# Patient Record
Sex: Male | Born: 1960 | Race: White | Hispanic: No | Marital: Married | Smoking: Never smoker
Health system: Southern US, Community
[De-identification: ages and names within clinical notes are randomized; demographics above are authoritative.]

## PROBLEM LIST (undated history)

## (undated) DIAGNOSIS — E785 Hyperlipidemia, unspecified: Secondary | ICD-10-CM

## (undated) DIAGNOSIS — K5190 Ulcerative colitis, unspecified, without complications: Secondary | ICD-10-CM

## (undated) HISTORY — DX: Hyperlipidemia, unspecified: E78.5

## (undated) HISTORY — PX: KNEE SURGERY: SHX244

## (undated) HISTORY — DX: Ulcerative colitis, unspecified, without complications: K51.90

## (undated) HISTORY — PX: WRIST FRACTURE SURGERY: SHX121

---

## 2005-06-14 ENCOUNTER — Emergency Department (HOSPITAL_COMMUNITY): Admission: EM | Admit: 2005-06-14 | Discharge: 2005-06-14 | Payer: Self-pay | Admitting: Emergency Medicine

## 2012-12-08 ENCOUNTER — Encounter: Payer: Federal, State, Local not specified - PPO | Attending: Family Medicine | Admitting: Dietician

## 2012-12-08 ENCOUNTER — Encounter: Payer: Self-pay | Admitting: Dietician

## 2012-12-08 VITALS — Ht 69.0 in | Wt 212.0 lb

## 2012-12-08 DIAGNOSIS — E119 Type 2 diabetes mellitus without complications: Secondary | ICD-10-CM

## 2012-12-08 DIAGNOSIS — Z713 Dietary counseling and surveillance: Secondary | ICD-10-CM | POA: Insufficient documentation

## 2012-12-08 NOTE — Progress Notes (Signed)
Patient was seen on 12/08/12 for the first of a series of three diabetes self-management courses at the [facility].  Current HbA1c: 15.2 in early November  The following learning objectives were met by the patient during this class:  Describe diabetes  State some common risk factors for diabetes  Defines the role of glucose and insulin  Identifies type of diabetes and pathophysiology  Describe the relationship between diabetes and cardiovascular risk  State the members of the Healthcare Team  States the rationale for glucose monitoring  State when to test glucose  State their individual Target Range  State the importance of logging glucose readings  Describe how to interpret glucose readings  Identifies A1C target  Explain the correlation between A1c and eAG values  State symptoms and treatment of high blood glucose  State symptoms and treatment of low blood glucose  Explain proper technique for glucose testing  Identifies proper sharps disposal  Handouts given during class include:  Living Well with Diabetes book  Carb Counting and Meal Planning book  Meal Plan Card  Carbohydrate guide  Meal planning worksheet  Low Sodium Flavoring Tips  The diabetes portion plate  Low Carbohydrate Snack Suggestions  A1c to eAG Conversion Chart  Diabetes Medications  Stress Management  Diabetes Recommended Care Schedule  Diabetes Success Plan  Core Class Satisfaction Survey  Your patient has identified their diabetes care support plan as:  [facility]  Staff  Follow-Up Plan:  Attend core 2

## 2012-12-08 NOTE — Patient Instructions (Signed)
Goals:  Monitor glucose levels as instructed by your doctor  Bring food record and glucose log to your next nutrition visit 

## 2012-12-28 ENCOUNTER — Encounter: Payer: Federal, State, Local not specified - PPO | Attending: Family Medicine

## 2012-12-28 DIAGNOSIS — E119 Type 2 diabetes mellitus without complications: Secondary | ICD-10-CM

## 2012-12-28 DIAGNOSIS — Z713 Dietary counseling and surveillance: Secondary | ICD-10-CM | POA: Insufficient documentation

## 2012-12-29 NOTE — Progress Notes (Signed)

## 2013-01-04 DIAGNOSIS — E119 Type 2 diabetes mellitus without complications: Secondary | ICD-10-CM

## 2013-01-04 NOTE — Progress Notes (Signed)
Patient was seen on 01/04/13 for the third of a series of three diabetes self-management courses at the [facility]. The following learning objectives were met by the patient during this class:    State the amount of activity recommended for healthy living   Describe activities suitable for individual needs   Identify ways to regularly incorporate activity into daily life   Identify barriers to activity and ways to over come these barriers  Identify diabetes medications being personally used and their primary action for lowering glucose and possible side effects   Describe role of stress on blood glucose and develop strategies to address psychosocial issues   Identify diabetes complications and ways to prevent them  Explain how to manage diabetes during illness   Evaluate success in meeting personal goal   Establish 2-3 goals that they will plan to diligently work on until they return for the  6-month follow-up visit  Goals:  Follow Diabetes Meal Plan as instructed  Aim for 15-30 mins of physical activity daily as tolerated  Bring food record and glucose log to your follow up visit  Your patient has established the following 4 month goals in their individualized success plan: I will count my carb choices at most meals and snacks I will increase my activity level at least 5 days a week  Your patient has identified these potential barriers to change:  Time  Your patient has identified their diabetes self-care support plan as  Family, specifically my wife and my sister - in - law who is a diabetes educator

## 2015-02-22 ENCOUNTER — Ambulatory Visit (HOSPITAL_BASED_OUTPATIENT_CLINIC_OR_DEPARTMENT_OTHER): Payer: Federal, State, Local not specified - PPO

## 2015-05-04 DIAGNOSIS — R3915 Urgency of urination: Secondary | ICD-10-CM | POA: Diagnosis not present

## 2015-05-08 DIAGNOSIS — N5202 Corporo-venous occlusive erectile dysfunction: Secondary | ICD-10-CM | POA: Diagnosis not present

## 2015-05-08 DIAGNOSIS — R3915 Urgency of urination: Secondary | ICD-10-CM | POA: Diagnosis not present

## 2015-05-30 DIAGNOSIS — E119 Type 2 diabetes mellitus without complications: Secondary | ICD-10-CM | POA: Diagnosis not present

## 2015-05-30 DIAGNOSIS — Z79899 Other long term (current) drug therapy: Secondary | ICD-10-CM | POA: Diagnosis not present

## 2015-05-30 DIAGNOSIS — E785 Hyperlipidemia, unspecified: Secondary | ICD-10-CM | POA: Diagnosis not present

## 2015-09-18 DIAGNOSIS — M11261 Other chondrocalcinosis, right knee: Secondary | ICD-10-CM | POA: Diagnosis not present

## 2015-09-18 DIAGNOSIS — Z1810 Retained metal fragments, unspecified: Secondary | ICD-10-CM | POA: Diagnosis not present

## 2015-09-18 DIAGNOSIS — M25561 Pain in right knee: Secondary | ICD-10-CM | POA: Diagnosis not present

## 2015-09-18 DIAGNOSIS — M1711 Unilateral primary osteoarthritis, right knee: Secondary | ICD-10-CM | POA: Diagnosis not present

## 2015-09-26 DIAGNOSIS — S8981XA Other specified injuries of right lower leg, initial encounter: Secondary | ICD-10-CM | POA: Diagnosis not present

## 2015-10-09 DIAGNOSIS — M25561 Pain in right knee: Secondary | ICD-10-CM | POA: Diagnosis not present

## 2015-10-09 DIAGNOSIS — Z23 Encounter for immunization: Secondary | ICD-10-CM | POA: Diagnosis not present

## 2015-10-09 DIAGNOSIS — Z0000 Encounter for general adult medical examination without abnormal findings: Secondary | ICD-10-CM | POA: Diagnosis not present

## 2015-10-09 DIAGNOSIS — K219 Gastro-esophageal reflux disease without esophagitis: Secondary | ICD-10-CM | POA: Diagnosis not present

## 2015-10-11 DIAGNOSIS — S8981XD Other specified injuries of right lower leg, subsequent encounter: Secondary | ICD-10-CM | POA: Diagnosis not present

## 2015-12-03 DIAGNOSIS — N529 Male erectile dysfunction, unspecified: Secondary | ICD-10-CM | POA: Diagnosis not present

## 2015-12-03 DIAGNOSIS — E785 Hyperlipidemia, unspecified: Secondary | ICD-10-CM | POA: Diagnosis not present

## 2015-12-03 DIAGNOSIS — J309 Allergic rhinitis, unspecified: Secondary | ICD-10-CM | POA: Diagnosis not present

## 2015-12-03 DIAGNOSIS — E119 Type 2 diabetes mellitus without complications: Secondary | ICD-10-CM | POA: Diagnosis not present

## 2016-01-19 DIAGNOSIS — J018 Other acute sinusitis: Secondary | ICD-10-CM | POA: Diagnosis not present

## 2016-02-26 DIAGNOSIS — J3081 Allergic rhinitis due to animal (cat) (dog) hair and dander: Secondary | ICD-10-CM | POA: Diagnosis not present

## 2016-02-26 DIAGNOSIS — H1045 Other chronic allergic conjunctivitis: Secondary | ICD-10-CM | POA: Diagnosis not present

## 2016-02-26 DIAGNOSIS — J3089 Other allergic rhinitis: Secondary | ICD-10-CM | POA: Diagnosis not present

## 2016-02-26 DIAGNOSIS — J4520 Mild intermittent asthma, uncomplicated: Secondary | ICD-10-CM | POA: Diagnosis not present

## 2016-06-02 DIAGNOSIS — E119 Type 2 diabetes mellitus without complications: Secondary | ICD-10-CM | POA: Diagnosis not present

## 2016-06-02 DIAGNOSIS — N529 Male erectile dysfunction, unspecified: Secondary | ICD-10-CM | POA: Diagnosis not present

## 2016-06-02 DIAGNOSIS — E785 Hyperlipidemia, unspecified: Secondary | ICD-10-CM | POA: Diagnosis not present

## 2016-06-02 DIAGNOSIS — J309 Allergic rhinitis, unspecified: Secondary | ICD-10-CM | POA: Diagnosis not present

## 2016-10-09 DIAGNOSIS — E785 Hyperlipidemia, unspecified: Secondary | ICD-10-CM | POA: Diagnosis not present

## 2016-10-09 DIAGNOSIS — Z23 Encounter for immunization: Secondary | ICD-10-CM | POA: Diagnosis not present

## 2016-10-09 DIAGNOSIS — Z1159 Encounter for screening for other viral diseases: Secondary | ICD-10-CM | POA: Diagnosis not present

## 2016-10-09 DIAGNOSIS — I10 Essential (primary) hypertension: Secondary | ICD-10-CM | POA: Diagnosis not present

## 2016-10-09 DIAGNOSIS — E119 Type 2 diabetes mellitus without complications: Secondary | ICD-10-CM | POA: Diagnosis not present

## 2016-10-09 DIAGNOSIS — K219 Gastro-esophageal reflux disease without esophagitis: Secondary | ICD-10-CM | POA: Diagnosis not present

## 2016-12-08 DIAGNOSIS — J309 Allergic rhinitis, unspecified: Secondary | ICD-10-CM | POA: Diagnosis not present

## 2016-12-08 DIAGNOSIS — E785 Hyperlipidemia, unspecified: Secondary | ICD-10-CM | POA: Diagnosis not present

## 2016-12-08 DIAGNOSIS — E119 Type 2 diabetes mellitus without complications: Secondary | ICD-10-CM | POA: Diagnosis not present

## 2016-12-08 DIAGNOSIS — N529 Male erectile dysfunction, unspecified: Secondary | ICD-10-CM | POA: Diagnosis not present

## 2017-01-03 DIAGNOSIS — B9689 Other specified bacterial agents as the cause of diseases classified elsewhere: Secondary | ICD-10-CM | POA: Diagnosis not present

## 2017-01-03 DIAGNOSIS — J019 Acute sinusitis, unspecified: Secondary | ICD-10-CM | POA: Diagnosis not present

## 2017-04-11 ENCOUNTER — Other Ambulatory Visit: Payer: Self-pay

## 2017-04-11 ENCOUNTER — Emergency Department (HOSPITAL_COMMUNITY)
Admission: EM | Admit: 2017-04-11 | Discharge: 2017-04-11 | Disposition: A | Discharge status: Home / Self Care | Payer: Federal, State, Local not specified - PPO | Attending: Emergency Medicine | Admitting: Emergency Medicine

## 2017-04-11 ENCOUNTER — Emergency Department (HOSPITAL_COMMUNITY): Payer: Federal, State, Local not specified - PPO

## 2017-04-11 ENCOUNTER — Encounter (HOSPITAL_COMMUNITY): Payer: Self-pay | Admitting: Emergency Medicine

## 2017-04-11 DIAGNOSIS — Y999 Unspecified external cause status: Secondary | ICD-10-CM | POA: Diagnosis not present

## 2017-04-11 DIAGNOSIS — Z23 Encounter for immunization: Secondary | ICD-10-CM | POA: Insufficient documentation

## 2017-04-11 DIAGNOSIS — Z79899 Other long term (current) drug therapy: Secondary | ICD-10-CM | POA: Insufficient documentation

## 2017-04-11 DIAGNOSIS — Y929 Unspecified place or not applicable: Secondary | ICD-10-CM | POA: Diagnosis not present

## 2017-04-11 DIAGNOSIS — Y9389 Activity, other specified: Secondary | ICD-10-CM | POA: Insufficient documentation

## 2017-04-11 DIAGNOSIS — W231XXA Caught, crushed, jammed, or pinched between stationary objects, initial encounter: Secondary | ICD-10-CM | POA: Insufficient documentation

## 2017-04-11 DIAGNOSIS — S61215A Laceration without foreign body of left ring finger without damage to nail, initial encounter: Secondary | ICD-10-CM | POA: Insufficient documentation

## 2017-04-11 MED ORDER — TETANUS-DIPHTH-ACELL PERTUSSIS 5-2.5-18.5 LF-MCG/0.5 IM SUSP: 0.5000 mL | Freq: Once | INTRAMUSCULAR | Status: DC

## 2017-04-11 MED ORDER — LIDOCAINE HCL (PF) 2 % IJ SOLN
INTRAMUSCULAR | Status: AC
  Filled 2017-04-11: qty 20

## 2017-04-11 MED ORDER — HYDROCODONE-ACETAMINOPHEN 5-325 MG PO TABS: 1.0000 | ORAL_TABLET | ORAL | 0 refills | Status: AC | PRN

## 2017-04-11 MED ORDER — LIDOCAINE HCL (PF) 2 % IJ SOLN: 10.0000 mL | Freq: Once | INTRAMUSCULAR | Status: DC

## 2017-04-11 NOTE — ED Notes (Signed)
Patient transported to X-ray 

## 2017-04-11 NOTE — ED Notes (Signed)
Pt ambulatory to waiting room. Pt verbalized understanding of discharge instructions.   

## 2017-04-11 NOTE — Discharge Instructions (Signed)
Have your sutures removed in 10 days. Change your dressing daily.  Keep your wound clean and dry,  Until a good scab forms - you may then wash gently twice daily with mild soap and water, but dry completely after.  Get rechecked for any sign of infection (redness,  Swelling,  Increased pain or drainage of purulent fluid). You may take the pain medicine prescribed if needed for pain relief.    This will make you drowsy - do not drive within 4 hours of taking this medication.

## 2017-04-11 NOTE — ED Triage Notes (Signed)
Pt c/o left ring laceration from crush injury.

## 2017-04-12 NOTE — ED Provider Notes (Signed)
[facility] Provider Note   CSN: [id number] Arrival date & time: 04/11/17  1849     History   Chief Complaint Chief Complaint  Patient presents with  . Laceration    HPI Michael Miles is a 57 y.o. male presenting with laceration to his left ring finger prior to arrival when the finger was crushed between 2 pieces of metal while trying to install an implement on his tractor.  He denies numbness distal to the injury site.  He has applied pressure to help with bleeding.  He denies any other injury.  He is left handed and is utd with his tetanus.  The history is provided by the patient.    Past Medical History:  Diagnosis Date  . Hyperlipidemia   . Ulcerative colitis (HCC)     There are no active problems to display for this patient.   Past Surgical History:  Procedure Laterality Date  . KNEE SURGERY    . WRIST FRACTURE SURGERY          Home Medications    Prior to Admission medications   Medication Sig Start Date End Date Taking? Authorizing Provider  atorvastatin (LIPITOR) 20 MG tablet Take 20 mg by mouth daily.    [provider]  azelastine (ASTELIN) 137 MCG/SPRAY nasal spray Place into both nostrils 2 (two) times daily. Use in each nostril as directed    [provider]  fexofenadine (ALLEGRA) 180 MG tablet Take 180 mg by mouth daily.    [provider]  FLUTICASONE PROPIONATE, NASAL, NA Place into the nose.    [provider]  HYDROcodone-acetaminophen (NORCO/VICODIN) 5-325 MG tablet Take 1 tablet by mouth every 4 (four) hours as needed. 04/11/17   Burgess AmorIdol, Stokely Jeancharles, PA-C  lisinopril (PRINIVIL,ZESTRIL) 5 MG tablet Take 5 mg by mouth daily.    [provider]  metFORMIN (GLUCOPHAGE) 500 MG tablet Take by mouth 2 (two) times daily with a meal.    [provider]  Omega-3 Krill Oil 300 MG CAPS Take by mouth.    [provider]  omeprazole (PRILOSEC) 10 MG capsule Take 20 mg by mouth  daily.    [provider]  sitaGLIPtin (JANUVIA) 100 MG tablet Take 100 mg by mouth daily.    [provider]  tadalafil (CIALIS) 5 MG tablet Take 5 mg by mouth daily as needed for erectile dysfunction.    [provider]    Family History No family history on file.  Social History Social History   Tobacco Use  . Smoking status: Never Smoker  . Smokeless tobacco: Never Used  Substance Use Topics  . Alcohol use: Yes  . Drug use: Never     Allergies   Benadryl [diphenhydramine]   Review of Systems Review of Systems  Constitutional: Negative.   Respiratory: Negative.   Cardiovascular: Negative.   Gastrointestinal: Negative.   Musculoskeletal: Positive for arthralgias. Negative for joint swelling and myalgias.  Skin: Positive for wound.  Neurological: Negative for weakness and numbness.     Physical Exam Updated Vital Signs BP (!) 136/99   Pulse 78   Temp 98.4 F (36.9 C)   Resp 17   Ht 5\' 11"  (1.803 m)   Wt 102.1 kg (225 lb)   SpO2 96%   BMI 31.38 kg/m   Physical Exam  Constitutional: He is oriented to person, place, and time. He appears well-developed and well-nourished.  HENT:  Head: Normocephalic.  Cardiovascular: Normal rate.  Pulmonary/Chest: Effort normal.  Musculoskeletal: He exhibits tenderness. He exhibits no deformity.       Left hand: He exhibits swelling. Normal sensation noted. Normal strength noted.       Hands: Neurological: He is alert and oriented to person, place, and time. No sensory deficit.  Skin: Laceration noted.     ED Treatments / Results  Labs (all labs ordered are listed, but only abnormal results are displayed) Labs Reviewed - No data to display  EKG None  Radiology Dg Finger Ring Left  Result Date: 04/11/2017 CLINICAL DATA:  Laceration from crush injury. EXAM: LEFT RING FINGER 2+V COMPARISON:  None FINDINGS: Three-view centered about the ring finger of the left hand demonstrate no fracture  or dislocation. No radiopaque foreign object. Mild soft tissue swelling distally. IMPRESSION: No acute osseous abnormality. Electronically Signed   By: Jeronimo Greaves M.D.   On: 04/11/2017 20:00    Procedures Procedures (including critical care time)  LACERATION REPAIR Performed by: Burgess Amor Authorized by: Burgess Amor Consent: Verbal consent obtained. Risks and benefits: risks, benefits and alternatives were discussed Consent given by: patient Patient identity confirmed: provided demographic data Prepped and Draped in normal sterile fashion Wound explored  Laceration Location: left ring finger  Laceration Length: 2 cm  No Foreign Bodies seen or palpated  Anesthesia: digital block  Local anesthetic: lidocaine 2% without epinephrine  Anesthetic total: 2 ml  Irrigation method: syringe Amount of cleaning: standard  Skin closure: ethilon 4-0   Number of sutures: 5  Technique: simple interupted  Patient tolerance: Patient tolerated the procedure well with no immediate complications.   Medications Ordered in ED Medications - No data to display   Initial Impression / Assessment and Plan / ED Course  I have reviewed the triage vital signs and the nursing notes.  Pertinent labs & imaging results that were available during my care of the patient were reviewed by me and considered in my medical decision making (see chart for details).     Wound care instructions given.  Pt advised to have sutures removed in 10 days,  Return here sooner for any signs of infection including redness, swelling, worse pain or drainage of pus.     Final Clinical Impressions(s) / ED Diagnoses   Final diagnoses:  Laceration of left ring finger without foreign body without damage to nail, initial encounter    ED Discharge Orders        Ordered    HYDROcodone-acetaminophen (NORCO/VICODIN) 5-325 MG tablet  Every 4 hours PRN     04/11/17 2047       Burgess Amor, PA-C 04/12/17 1645      Linwood Dibbles, MD 04/20/17 [phone]

## 2017-06-04 DIAGNOSIS — M62830 Muscle spasm of back: Secondary | ICD-10-CM | POA: Diagnosis not present

## 2017-06-04 DIAGNOSIS — M9903 Segmental and somatic dysfunction of lumbar region: Secondary | ICD-10-CM | POA: Diagnosis not present

## 2017-06-09 DIAGNOSIS — M9901 Segmental and somatic dysfunction of cervical region: Secondary | ICD-10-CM | POA: Diagnosis not present

## 2017-06-09 DIAGNOSIS — M5412 Radiculopathy, cervical region: Secondary | ICD-10-CM | POA: Diagnosis not present

## 2017-06-10 DIAGNOSIS — N529 Male erectile dysfunction, unspecified: Secondary | ICD-10-CM | POA: Diagnosis not present

## 2017-06-10 DIAGNOSIS — J309 Allergic rhinitis, unspecified: Secondary | ICD-10-CM | POA: Diagnosis not present

## 2017-06-10 DIAGNOSIS — E785 Hyperlipidemia, unspecified: Secondary | ICD-10-CM | POA: Diagnosis not present

## 2017-06-10 DIAGNOSIS — E119 Type 2 diabetes mellitus without complications: Secondary | ICD-10-CM | POA: Diagnosis not present

## 2017-06-10 DIAGNOSIS — M5412 Radiculopathy, cervical region: Secondary | ICD-10-CM | POA: Diagnosis not present

## 2017-06-10 DIAGNOSIS — M9901 Segmental and somatic dysfunction of cervical region: Secondary | ICD-10-CM | POA: Diagnosis not present

## 2017-06-11 DIAGNOSIS — M9901 Segmental and somatic dysfunction of cervical region: Secondary | ICD-10-CM | POA: Diagnosis not present

## 2017-06-11 DIAGNOSIS — M5412 Radiculopathy, cervical region: Secondary | ICD-10-CM | POA: Diagnosis not present

## 2017-07-03 ENCOUNTER — Ambulatory Visit (HOSPITAL_BASED_OUTPATIENT_CLINIC_OR_DEPARTMENT_OTHER)
Admission: RE | Admit: 2017-07-03 | Discharge: 2017-07-03 | Disposition: A | Discharge status: Home / Self Care | Payer: Federal, State, Local not specified - PPO | Source: Ambulatory Visit | Attending: Physician Assistant | Admitting: Physician Assistant

## 2017-07-03 ENCOUNTER — Other Ambulatory Visit (HOSPITAL_BASED_OUTPATIENT_CLINIC_OR_DEPARTMENT_OTHER): Payer: Self-pay | Admitting: Physician Assistant

## 2017-07-03 DIAGNOSIS — R109 Unspecified abdominal pain: Secondary | ICD-10-CM

## 2017-07-28 ENCOUNTER — Other Ambulatory Visit: Payer: Self-pay | Admitting: Gastroenterology

## 2017-07-28 DIAGNOSIS — R1011 Right upper quadrant pain: Secondary | ICD-10-CM

## 2017-07-31 ENCOUNTER — Ambulatory Visit (HOSPITAL_COMMUNITY)
Admission: RE | Admit: 2017-07-31 | Discharge: 2017-07-31 | Disposition: A | Discharge status: Home / Self Care | Payer: Federal, State, Local not specified - PPO | Source: Ambulatory Visit | Attending: Gastroenterology | Admitting: Gastroenterology

## 2017-07-31 DIAGNOSIS — R1011 Right upper quadrant pain: Secondary | ICD-10-CM | POA: Insufficient documentation

## 2017-07-31 DIAGNOSIS — K760 Fatty (change of) liver, not elsewhere classified: Secondary | ICD-10-CM | POA: Diagnosis not present

## 2017-08-04 ENCOUNTER — Other Ambulatory Visit: Payer: Federal, State, Local not specified - PPO

## 2017-09-04 DIAGNOSIS — K2930 Chronic superficial gastritis without bleeding: Secondary | ICD-10-CM | POA: Diagnosis not present

## 2017-09-04 DIAGNOSIS — R1011 Right upper quadrant pain: Secondary | ICD-10-CM | POA: Diagnosis not present

## 2017-09-04 DIAGNOSIS — R1013 Epigastric pain: Secondary | ICD-10-CM | POA: Diagnosis not present

## 2017-09-10 DIAGNOSIS — K2930 Chronic superficial gastritis without bleeding: Secondary | ICD-10-CM | POA: Diagnosis not present

## 2017-09-18 DIAGNOSIS — E785 Hyperlipidemia, unspecified: Secondary | ICD-10-CM | POA: Diagnosis not present

## 2017-09-18 DIAGNOSIS — E119 Type 2 diabetes mellitus without complications: Secondary | ICD-10-CM | POA: Diagnosis not present

## 2017-09-18 DIAGNOSIS — I10 Essential (primary) hypertension: Secondary | ICD-10-CM | POA: Diagnosis not present

## 2017-09-18 DIAGNOSIS — R1011 Right upper quadrant pain: Secondary | ICD-10-CM | POA: Diagnosis not present

## 2017-10-12 DIAGNOSIS — Z23 Encounter for immunization: Secondary | ICD-10-CM | POA: Diagnosis not present

## 2017-10-12 DIAGNOSIS — Z86010 Personal history of colonic polyps: Secondary | ICD-10-CM | POA: Diagnosis not present

## 2017-10-12 DIAGNOSIS — K5190 Ulcerative colitis, unspecified, without complications: Secondary | ICD-10-CM | POA: Diagnosis not present

## 2017-11-18 DIAGNOSIS — J0140 Acute pansinusitis, unspecified: Secondary | ICD-10-CM | POA: Diagnosis not present

## 2017-12-04 DIAGNOSIS — Z1211 Encounter for screening for malignant neoplasm of colon: Secondary | ICD-10-CM | POA: Diagnosis not present

## 2017-12-04 DIAGNOSIS — K5190 Ulcerative colitis, unspecified, without complications: Secondary | ICD-10-CM | POA: Diagnosis not present

## 2017-12-04 DIAGNOSIS — K529 Noninfective gastroenteritis and colitis, unspecified: Secondary | ICD-10-CM | POA: Diagnosis not present

## 2017-12-04 DIAGNOSIS — Z86010 Personal history of colonic polyps: Secondary | ICD-10-CM | POA: Diagnosis not present

## 2017-12-09 DIAGNOSIS — E119 Type 2 diabetes mellitus without complications: Secondary | ICD-10-CM | POA: Diagnosis not present

## 2017-12-09 DIAGNOSIS — Z23 Encounter for immunization: Secondary | ICD-10-CM | POA: Diagnosis not present

## 2017-12-09 DIAGNOSIS — E785 Hyperlipidemia, unspecified: Secondary | ICD-10-CM | POA: Diagnosis not present

## 2018-02-19 DIAGNOSIS — H35363 Drusen (degenerative) of macula, bilateral: Secondary | ICD-10-CM | POA: Diagnosis not present

## 2018-02-19 DIAGNOSIS — E119 Type 2 diabetes mellitus without complications: Secondary | ICD-10-CM | POA: Diagnosis not present

## 2018-02-19 DIAGNOSIS — H524 Presbyopia: Secondary | ICD-10-CM | POA: Diagnosis not present

## 2018-02-19 DIAGNOSIS — H04123 Dry eye syndrome of bilateral lacrimal glands: Secondary | ICD-10-CM | POA: Diagnosis not present

## 2018-09-14 DIAGNOSIS — Z0189 Encounter for other specified special examinations: Secondary | ICD-10-CM | POA: Diagnosis not present

## 2018-09-21 DIAGNOSIS — M79642 Pain in left hand: Secondary | ICD-10-CM | POA: Diagnosis not present

## 2018-09-21 DIAGNOSIS — E119 Type 2 diabetes mellitus without complications: Secondary | ICD-10-CM | POA: Diagnosis not present

## 2018-09-21 DIAGNOSIS — Z7984 Long term (current) use of oral hypoglycemic drugs: Secondary | ICD-10-CM | POA: Diagnosis not present

## 2018-09-21 DIAGNOSIS — M79641 Pain in right hand: Secondary | ICD-10-CM | POA: Diagnosis not present

## 2018-10-12 DIAGNOSIS — Z23 Encounter for immunization: Secondary | ICD-10-CM | POA: Diagnosis not present

## 2018-11-03 DIAGNOSIS — K219 Gastro-esophageal reflux disease without esophagitis: Secondary | ICD-10-CM | POA: Diagnosis not present

## 2018-11-03 DIAGNOSIS — K5130 Ulcerative (chronic) rectosigmoiditis without complications: Secondary | ICD-10-CM | POA: Diagnosis not present

## 2018-12-05 IMAGING — CT CT RENAL STONE PROTOCOL
2 of 4 series · 16 of 46 positions shown, 18 images · non-contrast
Comparison: None.

CLINICAL DATA: Right flank pain and dysuria

EXAM:
CT ABDOMEN AND PELVIS WITHOUT CONTRAST
TECHNIQUE: Multidetector CT imaging of the abdomen and pelvis was performed
following the standard protocol without oral or IV contrast.

[Series 2: axial st · axial · 0.90mm/px · z∈[-516,-36]mm · 13 of 106 slices shown, 15 images]
[im 5/106  soft-tissue]
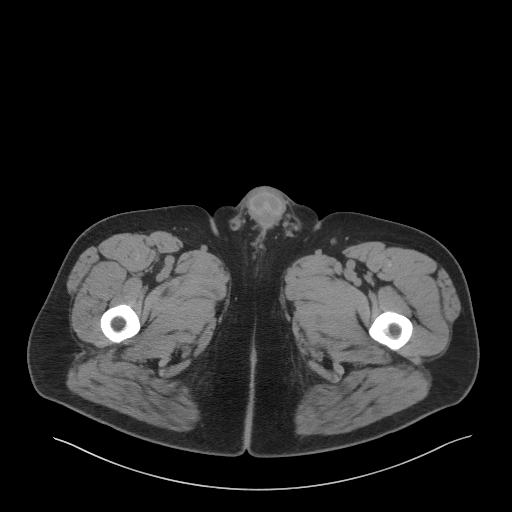
[im 5/106  bone]
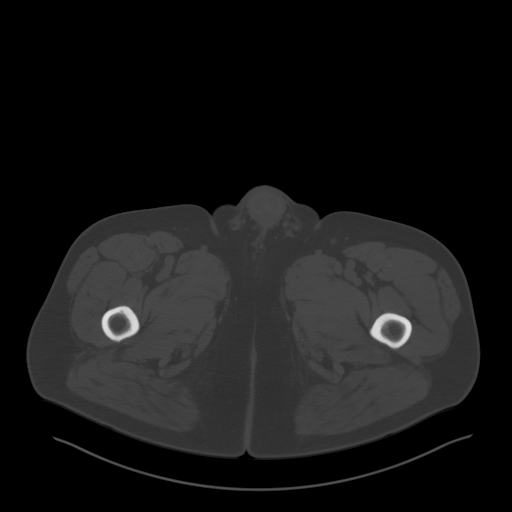
[im 13/106  soft-tissue]
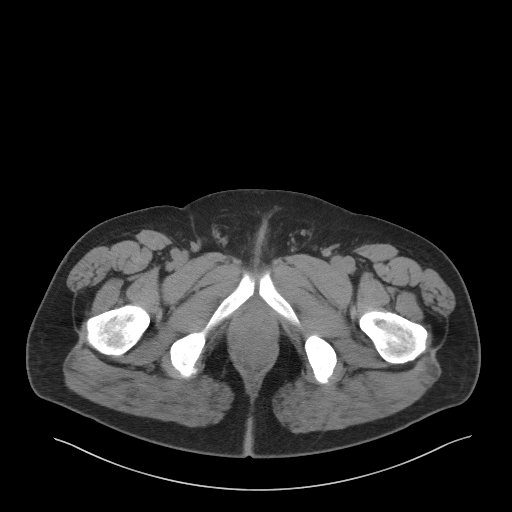
[im 21/106  soft-tissue]
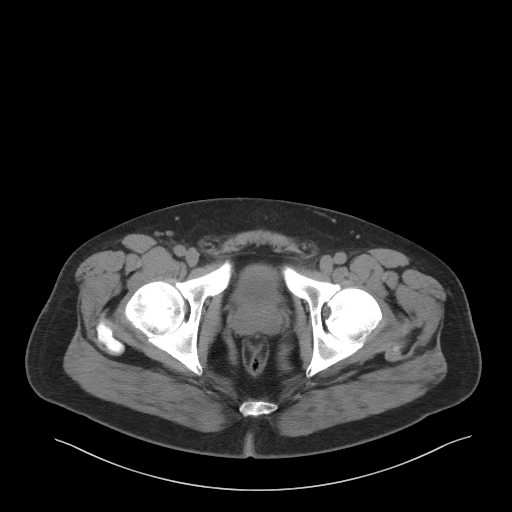
[im 29/106  soft-tissue]
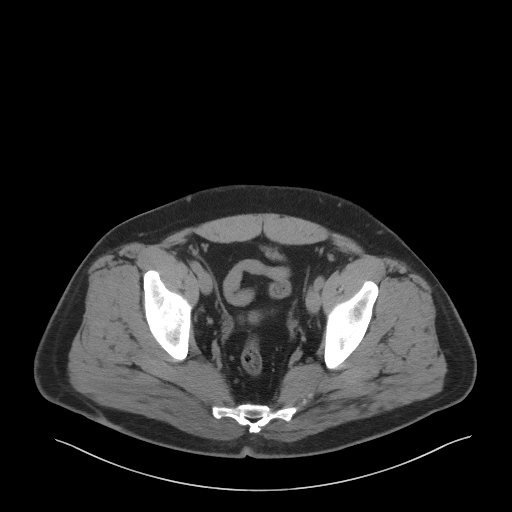
[im 37/106  soft-tissue]
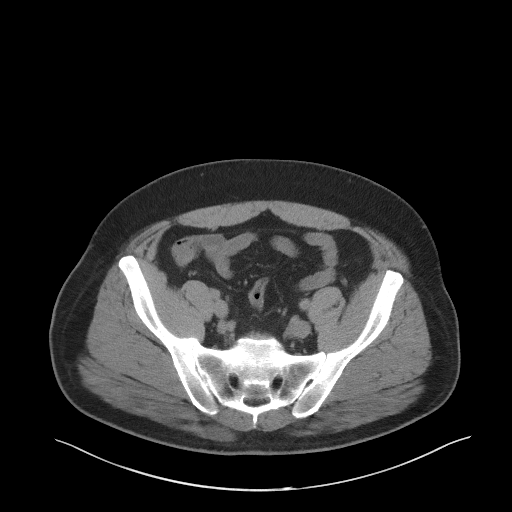
[im 45/106  soft-tissue]
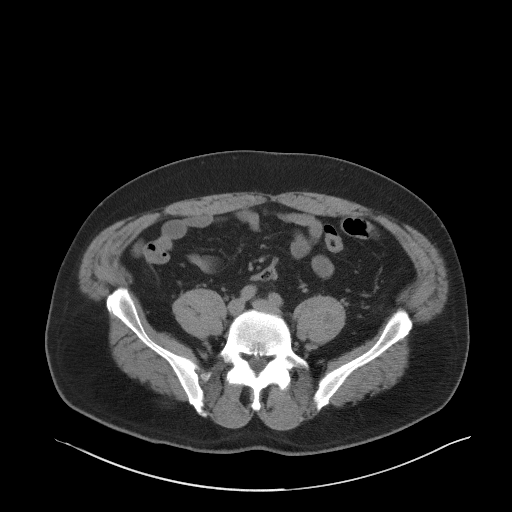
[im 53/106  soft-tissue]
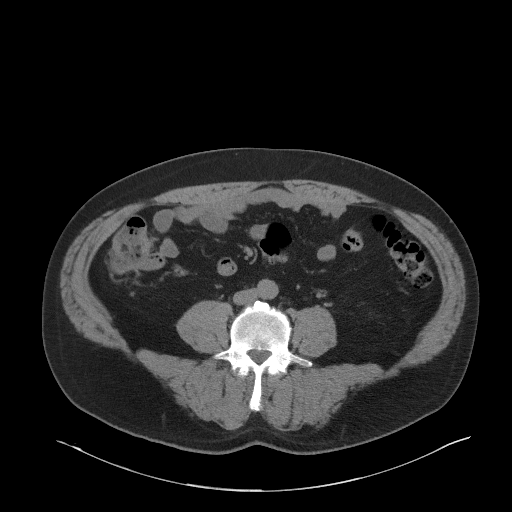
[im 61/106  soft-tissue]
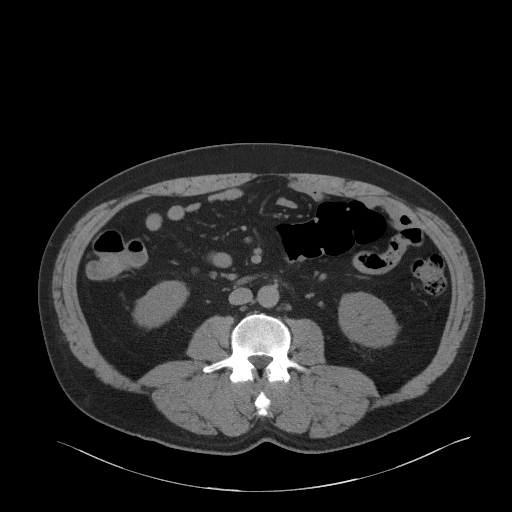
[im 69/106  soft-tissue]
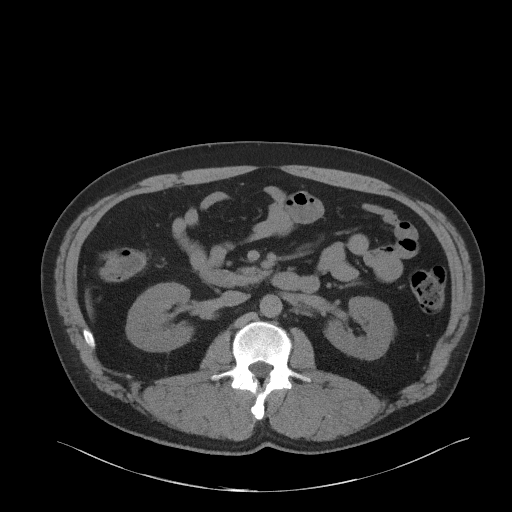
[im 69/106  bone]
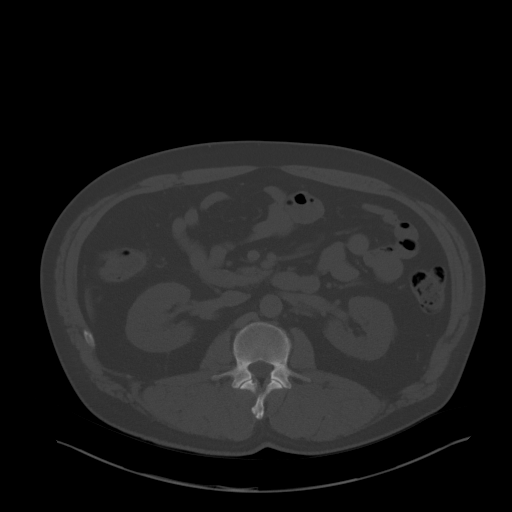
[im 77/106  soft-tissue]
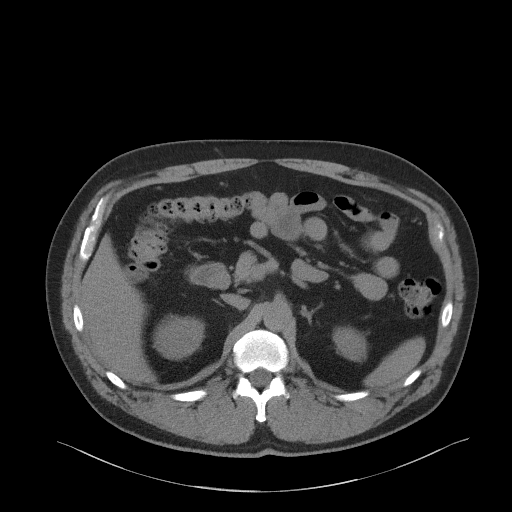
[im 85/106  soft-tissue]
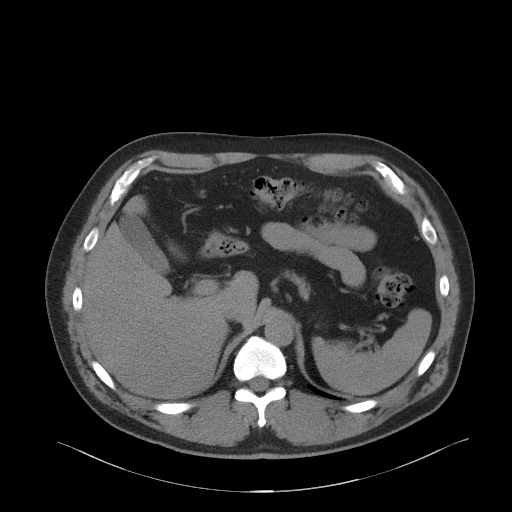
[im 93/106  soft-tissue]
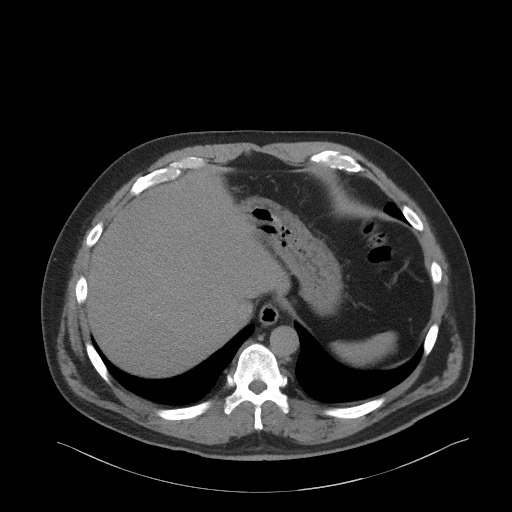
[im 101/106  soft-tissue]
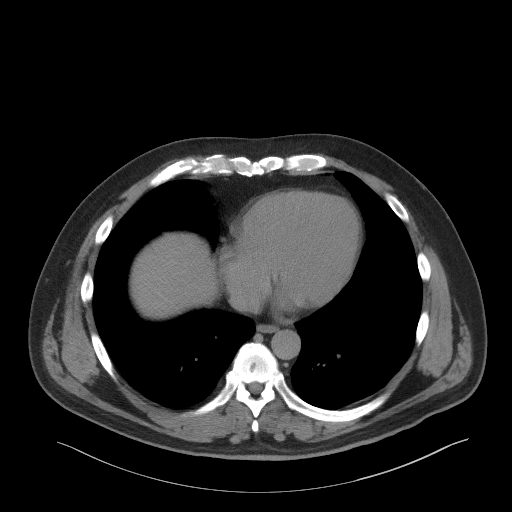

[Series 5: coronal st · coronal · 0.84mm/px · 3 of 103 slices shown]
[im 35/103  soft-tissue]
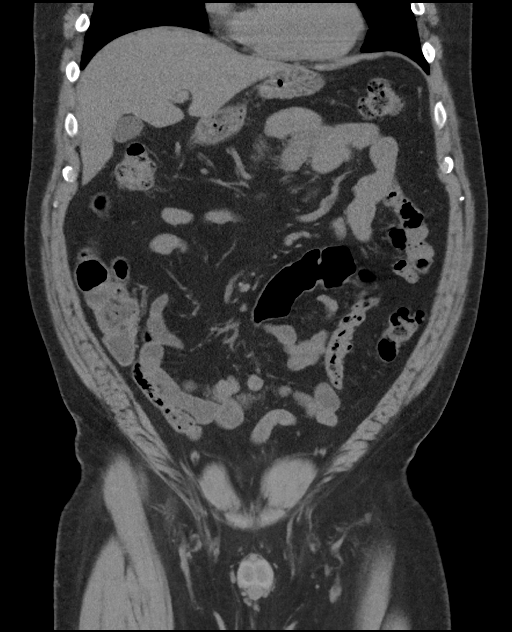
[im 46/103  soft-tissue]
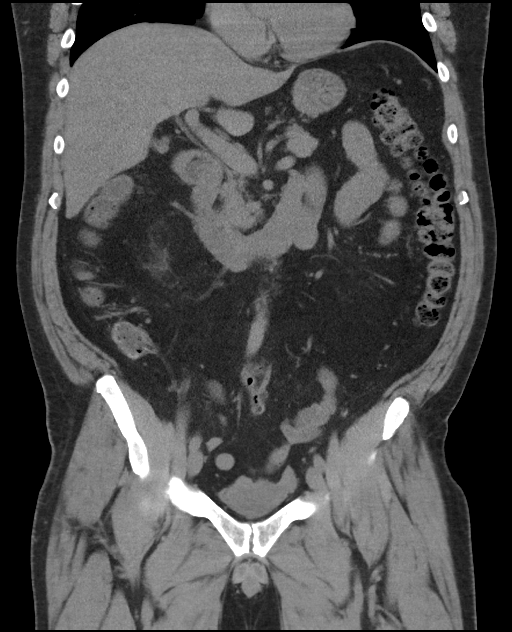
[im 57/103  soft-tissue]
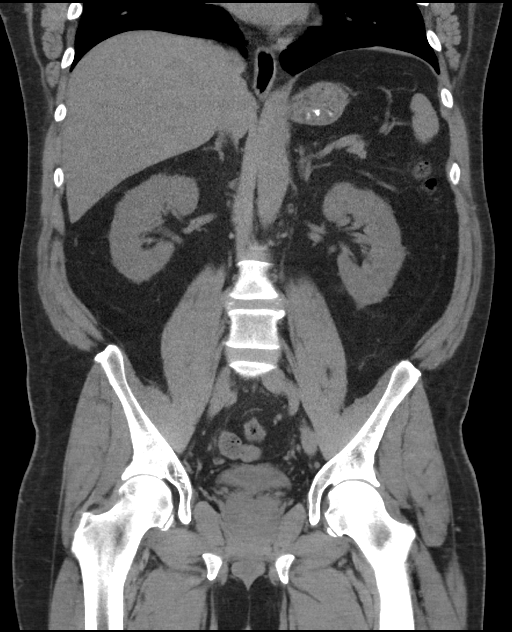

[16 of 46 positions shown; findings below may reference images not displayed]

FINDINGS: Lower chest: Lung bases are clear.

Hepatobiliary: No focal liver lesions are apparent on this
noncontrast enhanced study. There is no appreciable gallbladder wall
thickening. No biliary duct dilatation.

Pancreas: No pancreatic mass or inflammatory focus.

Spleen: No splenic lesions are evident.

Adrenals/Urinary Tract: Adrenals bilaterally appear normal. There is
a cyst in the mid right kidney measuring 1.7 x 1.4 cm. There is no
evident hydronephrosis on either side. There is no appreciable renal
or ureteral calculus on either side. There is a phlebolith in the
right pelvis which is inferior to the distal ureter. Urinary bladder
is midline. Urinary bladder wall thickness is felt to be within
normal limits for degree of distention.

Stomach/Bowel: There is no appreciable bowel wall or mesenteric
thickening. There is no evident bowel obstruction. No free air or
portal venous air.

Vascular/Lymphatic: There is no abdominal aortic aneurysm. No
vascular lesions are appreciable on this study. There is no
adenopathy evident in the abdomen or pelvis.

Reproductive: Prostate and seminal vesicles appear normal in size
and contour. No evident pelvic mass.

Other: Appendix appears unremarkable. No abscess or ascites is
evident in the abdomen or pelvis.

Musculoskeletal: There are no blastic or lytic bone lesions. No
intramuscular or abdominal wall lesion evident.
IMPRESSION: 1. No evident renal or ureteral calculus. No hydronephrosis. Urinary
bladder nearly empty at this time with wall thickness felt to be
within normal limits given this circumstance.

2. No evident bowel obstruction. No abscess. Appendix region appears
normal.

3. A cause for patient's symptoms has not been established with this
study.

## 2019-02-11 DIAGNOSIS — Z7984 Long term (current) use of oral hypoglycemic drugs: Secondary | ICD-10-CM | POA: Diagnosis not present

## 2019-02-11 DIAGNOSIS — E119 Type 2 diabetes mellitus without complications: Secondary | ICD-10-CM | POA: Diagnosis not present

## 2019-02-18 DIAGNOSIS — M25541 Pain in joints of right hand: Secondary | ICD-10-CM | POA: Diagnosis not present

## 2019-02-18 DIAGNOSIS — E119 Type 2 diabetes mellitus without complications: Secondary | ICD-10-CM | POA: Diagnosis not present

## 2019-02-18 DIAGNOSIS — J309 Allergic rhinitis, unspecified: Secondary | ICD-10-CM | POA: Diagnosis not present

## 2019-02-18 DIAGNOSIS — M25542 Pain in joints of left hand: Secondary | ICD-10-CM | POA: Diagnosis not present

## 2019-02-22 DIAGNOSIS — E119 Type 2 diabetes mellitus without complications: Secondary | ICD-10-CM | POA: Diagnosis not present

## 2019-02-22 DIAGNOSIS — S80251D Superficial foreign body, right knee, subsequent encounter: Secondary | ICD-10-CM | POA: Diagnosis not present

## 2019-02-22 DIAGNOSIS — Z135 Encounter for screening for eye and ear disorders: Secondary | ICD-10-CM | POA: Diagnosis not present

## 2019-02-22 DIAGNOSIS — Z9889 Other specified postprocedural states: Secondary | ICD-10-CM | POA: Diagnosis not present

## 2019-02-22 DIAGNOSIS — M25542 Pain in joints of left hand: Secondary | ICD-10-CM | POA: Diagnosis not present

## 2019-02-22 DIAGNOSIS — N400 Enlarged prostate without lower urinary tract symptoms: Secondary | ICD-10-CM | POA: Diagnosis not present

## 2019-02-22 DIAGNOSIS — M170 Bilateral primary osteoarthritis of knee: Secondary | ICD-10-CM | POA: Diagnosis not present

## 2019-02-22 DIAGNOSIS — Z7984 Long term (current) use of oral hypoglycemic drugs: Secondary | ICD-10-CM | POA: Diagnosis not present

## 2019-02-22 DIAGNOSIS — E113293 Type 2 diabetes mellitus with mild nonproliferative diabetic retinopathy without macular edema, bilateral: Secondary | ICD-10-CM | POA: Diagnosis not present

## 2019-02-22 DIAGNOSIS — M189 Osteoarthritis of first carpometacarpal joint, unspecified: Secondary | ICD-10-CM | POA: Diagnosis not present

## 2019-02-22 DIAGNOSIS — M11261 Other chondrocalcinosis, right knee: Secondary | ICD-10-CM | POA: Diagnosis not present

## 2019-03-01 DIAGNOSIS — M19042 Primary osteoarthritis, left hand: Secondary | ICD-10-CM | POA: Diagnosis not present

## 2019-03-26 DIAGNOSIS — Z23 Encounter for immunization: Secondary | ICD-10-CM | POA: Diagnosis not present

## 2019-04-08 DIAGNOSIS — R937 Abnormal findings on diagnostic imaging of other parts of musculoskeletal system: Secondary | ICD-10-CM | POA: Diagnosis not present

## 2019-04-08 DIAGNOSIS — M19042 Primary osteoarthritis, left hand: Secondary | ICD-10-CM | POA: Diagnosis not present

## 2019-04-28 DIAGNOSIS — M79642 Pain in left hand: Secondary | ICD-10-CM | POA: Diagnosis not present

## 2019-05-23 DIAGNOSIS — M170 Bilateral primary osteoarthritis of knee: Secondary | ICD-10-CM | POA: Diagnosis not present

## 2019-05-23 DIAGNOSIS — R269 Unspecified abnormalities of gait and mobility: Secondary | ICD-10-CM | POA: Diagnosis not present

## 2019-08-17 DIAGNOSIS — N400 Enlarged prostate without lower urinary tract symptoms: Secondary | ICD-10-CM | POA: Diagnosis not present

## 2019-08-17 DIAGNOSIS — R001 Bradycardia, unspecified: Secondary | ICD-10-CM | POA: Diagnosis not present

## 2019-08-17 DIAGNOSIS — J309 Allergic rhinitis, unspecified: Secondary | ICD-10-CM | POA: Diagnosis not present

## 2019-08-17 DIAGNOSIS — K219 Gastro-esophageal reflux disease without esophagitis: Secondary | ICD-10-CM | POA: Diagnosis not present

## 2019-08-17 DIAGNOSIS — E119 Type 2 diabetes mellitus without complications: Secondary | ICD-10-CM | POA: Diagnosis not present

## 2019-08-26 DIAGNOSIS — R5383 Other fatigue: Secondary | ICD-10-CM | POA: Diagnosis not present

## 2019-08-26 DIAGNOSIS — R079 Chest pain, unspecified: Secondary | ICD-10-CM | POA: Diagnosis not present

## 2019-08-31 DIAGNOSIS — H04123 Dry eye syndrome of bilateral lacrimal glands: Secondary | ICD-10-CM | POA: Diagnosis not present

## 2019-08-31 DIAGNOSIS — D22122 Melanocytic nevi of left lower eyelid, including canthus: Secondary | ICD-10-CM | POA: Diagnosis not present

## 2019-08-31 DIAGNOSIS — E119 Type 2 diabetes mellitus without complications: Secondary | ICD-10-CM | POA: Diagnosis not present

## 2019-08-31 DIAGNOSIS — Z961 Presence of intraocular lens: Secondary | ICD-10-CM | POA: Diagnosis not present

## 2019-09-26 DIAGNOSIS — R0789 Other chest pain: Secondary | ICD-10-CM | POA: Diagnosis not present

## 2019-09-26 DIAGNOSIS — Z7984 Long term (current) use of oral hypoglycemic drugs: Secondary | ICD-10-CM | POA: Diagnosis not present

## 2019-09-26 DIAGNOSIS — K219 Gastro-esophageal reflux disease without esophagitis: Secondary | ICD-10-CM | POA: Diagnosis not present

## 2019-09-26 DIAGNOSIS — E119 Type 2 diabetes mellitus without complications: Secondary | ICD-10-CM | POA: Diagnosis not present

## 2019-10-20 DIAGNOSIS — R0789 Other chest pain: Secondary | ICD-10-CM | POA: Diagnosis not present

## 2019-12-29 DIAGNOSIS — Z23 Encounter for immunization: Secondary | ICD-10-CM | POA: Diagnosis not present
